# Patient Record
Sex: Female | Born: 1955 | Race: White | Hispanic: No
Health system: Southern US, Community
[De-identification: ages and names within clinical notes are randomized; demographics above are authoritative.]

---

## 2001-10-12 ENCOUNTER — Encounter: Admission: RE | Admit: 2001-10-12 | Discharge: 2001-10-12 | Payer: Self-pay | Admitting: Specialist

## 2001-10-12 ENCOUNTER — Encounter: Payer: Self-pay | Admitting: Specialist

## 2001-11-09 ENCOUNTER — Observation Stay (HOSPITAL_COMMUNITY): Admission: RE | Admit: 2001-11-09 | Discharge: 2001-11-11 | Payer: Self-pay | Admitting: Specialist

## 2001-11-09 ENCOUNTER — Encounter: Payer: Self-pay | Admitting: Specialist

## 2002-08-13 ENCOUNTER — Encounter: Admission: RE | Admit: 2002-08-13 | Discharge: 2002-08-13 | Payer: Self-pay | Admitting: Orthopaedic Surgery

## 2002-08-13 ENCOUNTER — Encounter: Payer: Self-pay | Admitting: Orthopaedic Surgery

## 2003-06-05 ENCOUNTER — Ambulatory Visit (HOSPITAL_COMMUNITY): Admission: RE | Admit: 2003-06-05 | Discharge: 2003-06-06 | Payer: Self-pay | Admitting: Orthopaedic Surgery

## 2003-09-11 ENCOUNTER — Observation Stay (HOSPITAL_COMMUNITY): Admission: RE | Admit: 2003-09-11 | Discharge: 2003-09-12 | Payer: Self-pay | Admitting: Specialist

## 2006-07-24 ENCOUNTER — Inpatient Hospital Stay: Payer: Self-pay | Admitting: Internal Medicine

## 2006-07-24 ENCOUNTER — Other Ambulatory Visit: Payer: Self-pay

## 2006-08-10 ENCOUNTER — Inpatient Hospital Stay: Payer: Self-pay | Admitting: Unknown Physician Specialty

## 2006-12-14 ENCOUNTER — Inpatient Hospital Stay: Payer: Self-pay | Admitting: Specialist

## 2006-12-14 ENCOUNTER — Other Ambulatory Visit: Payer: Self-pay

## 2007-04-08 ENCOUNTER — Inpatient Hospital Stay: Payer: Self-pay | Admitting: Unknown Physician Specialty

## 2012-10-06 ENCOUNTER — Emergency Department: Payer: Self-pay | Admitting: Emergency Medicine

## 2012-10-06 LAB — COMPREHENSIVE METABOLIC PANEL
Albumin: 3.7 g/dL (ref 3.4–5.0)
Alkaline Phosphatase: 130 U/L (ref 50–136)
Anion Gap: 5 — ABNORMAL LOW (ref 7–16)
Creatinine: 0.7 mg/dL (ref 0.60–1.30)
EGFR (Non-African Amer.): >60
Glucose: 103 mg/dL — ABNORMAL HIGH (ref 65–99)
Osmolality: 272 (ref 275–301)
Potassium: 4.3 mmol/L (ref 3.5–5.1)
SGPT (ALT): 28 U/L (ref 12–78)
Total Protein: 7.6 g/dL (ref 6.4–8.2)

## 2012-10-06 LAB — CBC
HCT: 31.6 % — ABNORMAL LOW (ref 35.0–47.0)
MCHC: 34 g/dL (ref 32.0–36.0)
MCV: 93 fL (ref 80–100)
Platelet: 281 10*3/uL (ref 150–440)
RBC: 3.39 10*6/uL — ABNORMAL LOW (ref 3.80–5.20)
WBC: 13.6 10*3/uL — ABNORMAL HIGH (ref 3.6–11.0)

## 2012-10-06 LAB — URINALYSIS, COMPLETE
Bacteria: NONE SEEN
Blood: NEGATIVE
Glucose,UR: NEGATIVE mg/dL (ref 0–75)
Hyaline Cast: 3
Nitrite: NEGATIVE
Protein: NEGATIVE
RBC,UR: 8 /HPF (ref 0–5)
Squamous Epithelial: 2
WBC UR: 37 /HPF (ref 0–5)

## 2012-12-31 ENCOUNTER — Ambulatory Visit: Payer: Self-pay | Admitting: Specialist

## 2012-12-31 LAB — CBC
HCT: 31.1 % — ABNORMAL LOW (ref 35.0–47.0)
HGB: 10.8 g/dL — ABNORMAL LOW (ref 12.0–16.0)
MCV: 94 fL (ref 80–100)
WBC: 13.3 10*3/uL — ABNORMAL HIGH (ref 3.6–11.0)

## 2012-12-31 LAB — URINALYSIS, COMPLETE
Bacteria: NONE SEEN
Bilirubin,UR: NEGATIVE
Ketone: NEGATIVE
Leukocyte Esterase: NEGATIVE
Protein: NEGATIVE
Specific Gravity: 1.011 (ref 1.003–1.030)
Squamous Epithelial: <1
WBC UR: <1 /HPF (ref 0–5)

## 2012-12-31 LAB — BASIC METABOLIC PANEL
Calcium, Total: 9.1 mg/dL (ref 8.5–10.1)
Chloride: 108 mmol/L — ABNORMAL HIGH (ref 98–107)
EGFR (Non-African Amer.): >60
Glucose: 109 mg/dL — ABNORMAL HIGH (ref 65–99)
Potassium: 4.3 mmol/L (ref 3.5–5.1)
Sodium: 139 mmol/L (ref 136–145)

## 2012-12-31 LAB — PROTIME-INR: Prothrombin Time: 12.3 secs (ref 11.5–14.7)

## 2012-12-31 LAB — MRSA PCR SCREENING

## 2012-12-31 LAB — APTT: Activated PTT: 32.5 secs (ref 23.6–35.9)

## 2013-01-07 ENCOUNTER — Inpatient Hospital Stay: Payer: Self-pay | Admitting: Specialist

## 2013-01-08 LAB — BASIC METABOLIC PANEL
BUN: 11 mg/dL (ref 7–18)
Chloride: 107 mmol/L (ref 98–107)
Creatinine: 0.82 mg/dL (ref 0.60–1.30)
Osmolality: 275 (ref 275–301)
Potassium: 3.7 mmol/L (ref 3.5–5.1)

## 2013-01-08 LAB — CBC WITH DIFFERENTIAL/PLATELET
Basophil #: 0 10*3/uL (ref 0.0–0.1)
Basophil %: 0.2 %
Eosinophil #: 0 10*3/uL (ref 0.0–0.7)
HGB: 7.7 g/dL — ABNORMAL LOW (ref 12.0–16.0)
MCH: 32.6 pg (ref 26.0–34.0)
MCHC: 34.3 g/dL (ref 32.0–36.0)
Platelet: 209 10*3/uL (ref 150–440)
RBC: 2.37 10*6/uL — ABNORMAL LOW (ref 3.80–5.20)
RDW: 14 % (ref 11.5–14.5)

## 2013-01-11 LAB — URINALYSIS, COMPLETE
Bacteria: NONE SEEN
Bilirubin,UR: NEGATIVE
Blood: NEGATIVE
RBC,UR: <1 /HPF (ref 0–5)
Specific Gravity: 1.005 (ref 1.003–1.030)
Squamous Epithelial: NONE SEEN

## 2013-02-14 ENCOUNTER — Ambulatory Visit: Payer: Self-pay | Admitting: Specialist

## 2013-03-20 ENCOUNTER — Emergency Department: Payer: Self-pay | Admitting: Emergency Medicine

## 2013-06-21 ENCOUNTER — Emergency Department: Payer: Self-pay | Admitting: Emergency Medicine

## 2013-06-21 LAB — COMPREHENSIVE METABOLIC PANEL WITH GFR
Albumin: 3.6 g/dL
Alkaline Phosphatase: 89 U/L
Anion Gap: 5 — ABNORMAL LOW
BUN: 21 mg/dL — ABNORMAL HIGH
Bilirubin,Total: 0.5 mg/dL
Calcium, Total: 8.8 mg/dL
Chloride: 109 mmol/L — ABNORMAL HIGH
Co2: 28 mmol/L
Creatinine: 0.84 mg/dL
EGFR (African American): >60
EGFR (Non-African Amer.): >60
Glucose: 91 mg/dL
Osmolality: 286
Potassium: 3.7 mmol/L
SGOT(AST): 8 U/L — ABNORMAL LOW
SGPT (ALT): 18 U/L
Sodium: 142 mmol/L
Total Protein: 6.8 g/dL

## 2013-06-21 LAB — CBC
HCT: 31 % — ABNORMAL LOW (ref 35.0–47.0)
HGB: 10.6 g/dL — AB (ref 12.0–16.0)
MCH: 33 pg (ref 26.0–34.0)
MCHC: 34.4 g/dL (ref 32.0–36.0)
MCV: 96 fL (ref 80–100)
PLATELETS: 147 10*3/uL — AB (ref 150–440)
RBC: 3.23 10*6/uL — AB (ref 3.80–5.20)
RDW: 14.1 % (ref 11.5–14.5)
WBC: 8.3 10*3/uL (ref 3.6–11.0)

## 2013-10-03 ENCOUNTER — Ambulatory Visit: Payer: Self-pay | Admitting: Orthopedic Surgery

## 2013-10-03 LAB — CBC WITH DIFFERENTIAL/PLATELET
Basophil #: 0.3 10*3/uL — ABNORMAL HIGH (ref 0.0–0.1)
Basophil %: 3.1 %
Eosinophil #: 0.1 10*3/uL (ref 0.0–0.7)
Eosinophil %: 0.8 %
HCT: 34 % — ABNORMAL LOW (ref 35.0–47.0)
HGB: 11.4 g/dL — ABNORMAL LOW (ref 12.0–16.0)
Lymphocyte #: 2 10*3/uL (ref 1.0–3.6)
Lymphocyte %: 24.4 %
MCH: 32 pg (ref 26.0–34.0)
MCHC: 33.5 g/dL (ref 32.0–36.0)
MCV: 95 fL (ref 80–100)
Monocyte #: 0.4 x10 3/mm (ref 0.2–0.9)
Monocyte %: 4.8 %
Neutrophil #: 5.4 10*3/uL (ref 1.4–6.5)
Neutrophil %: 66.9 %
Platelet: 149 10*3/uL — ABNORMAL LOW (ref 150–440)
RBC: 3.56 10*6/uL — ABNORMAL LOW (ref 3.80–5.20)
RDW: 13.7 % (ref 11.5–14.5)
WBC: 8.1 10*3/uL (ref 3.6–11.0)

## 2013-10-03 LAB — COMPREHENSIVE METABOLIC PANEL
ALK PHOS: 119 U/L — AB
ALT: 23 U/L (ref 12–78)
ANION GAP: 8 (ref 7–16)
AST: 11 U/L — AB (ref 15–37)
Albumin: 4.1 g/dL (ref 3.4–5.0)
BUN: 14 mg/dL (ref 7–18)
Bilirubin,Total: 0.3 mg/dL (ref 0.2–1.0)
CALCIUM: 9.2 mg/dL (ref 8.5–10.1)
CO2: 28 mmol/L (ref 21–32)
CREATININE: 0.66 mg/dL (ref 0.60–1.30)
Chloride: 105 mmol/L (ref 98–107)
EGFR (Non-African Amer.): >60
Glucose: 106 mg/dL — ABNORMAL HIGH (ref 65–99)
Osmolality: 282 (ref 275–301)
POTASSIUM: 3.8 mmol/L (ref 3.5–5.1)
Sodium: 141 mmol/L (ref 136–145)
Total Protein: 7.5 g/dL (ref 6.4–8.2)

## 2013-10-03 LAB — PROTIME-INR
INR: 1
Prothrombin Time: 12.7 secs (ref 11.5–14.7)

## 2013-11-19 ENCOUNTER — Observation Stay: Payer: Self-pay | Admitting: Specialist

## 2013-11-19 LAB — CBC WITH DIFFERENTIAL/PLATELET
BASOS PCT: 0.5 %
Basophil #: 0 10*3/uL (ref 0.0–0.1)
EOS PCT: 3.7 %
Eosinophil #: 0.4 10*3/uL (ref 0.0–0.7)
HCT: 32.1 % — ABNORMAL LOW (ref 35.0–47.0)
HGB: 10.8 g/dL — ABNORMAL LOW (ref 12.0–16.0)
LYMPHS ABS: 2.4 10*3/uL (ref 1.0–3.6)
LYMPHS PCT: 24.8 %
MCH: 31.8 pg (ref 26.0–34.0)
MCHC: 33.5 g/dL (ref 32.0–36.0)
MCV: 95 fL (ref 80–100)
MONO ABS: 0.5 x10 3/mm (ref 0.2–0.9)
MONOS PCT: 4.8 %
NEUTROS PCT: 66.2 %
Neutrophil #: 6.5 10*3/uL (ref 1.4–6.5)
PLATELETS: 155 10*3/uL (ref 150–440)
RBC: 3.38 10*6/uL — ABNORMAL LOW (ref 3.80–5.20)
RDW: 13.6 % (ref 11.5–14.5)
WBC: 9.8 10*3/uL (ref 3.6–11.0)

## 2013-12-30 ENCOUNTER — Emergency Department: Payer: Self-pay | Admitting: Emergency Medicine

## 2014-08-25 IMAGING — CR DG HIP 1V*L*
1 series · 1 of 1 positions shown · non-contrast
Comparison: none

REASON FOR EXAM: POST OP
COMMENTS:   Bedside (portable):Y

PROCEDURE:     DXR - DXR HIP LEFT ONE VIEW  - January 07, 2013  [DATE]
RESULT:     Findings: AP pelvis and lateral view of the left hip
demonstrates a total left hip arthroplasty without dislocation. There are
postsurgical changes surrounding the left hip. The right hip is unremarkable.

[lat]
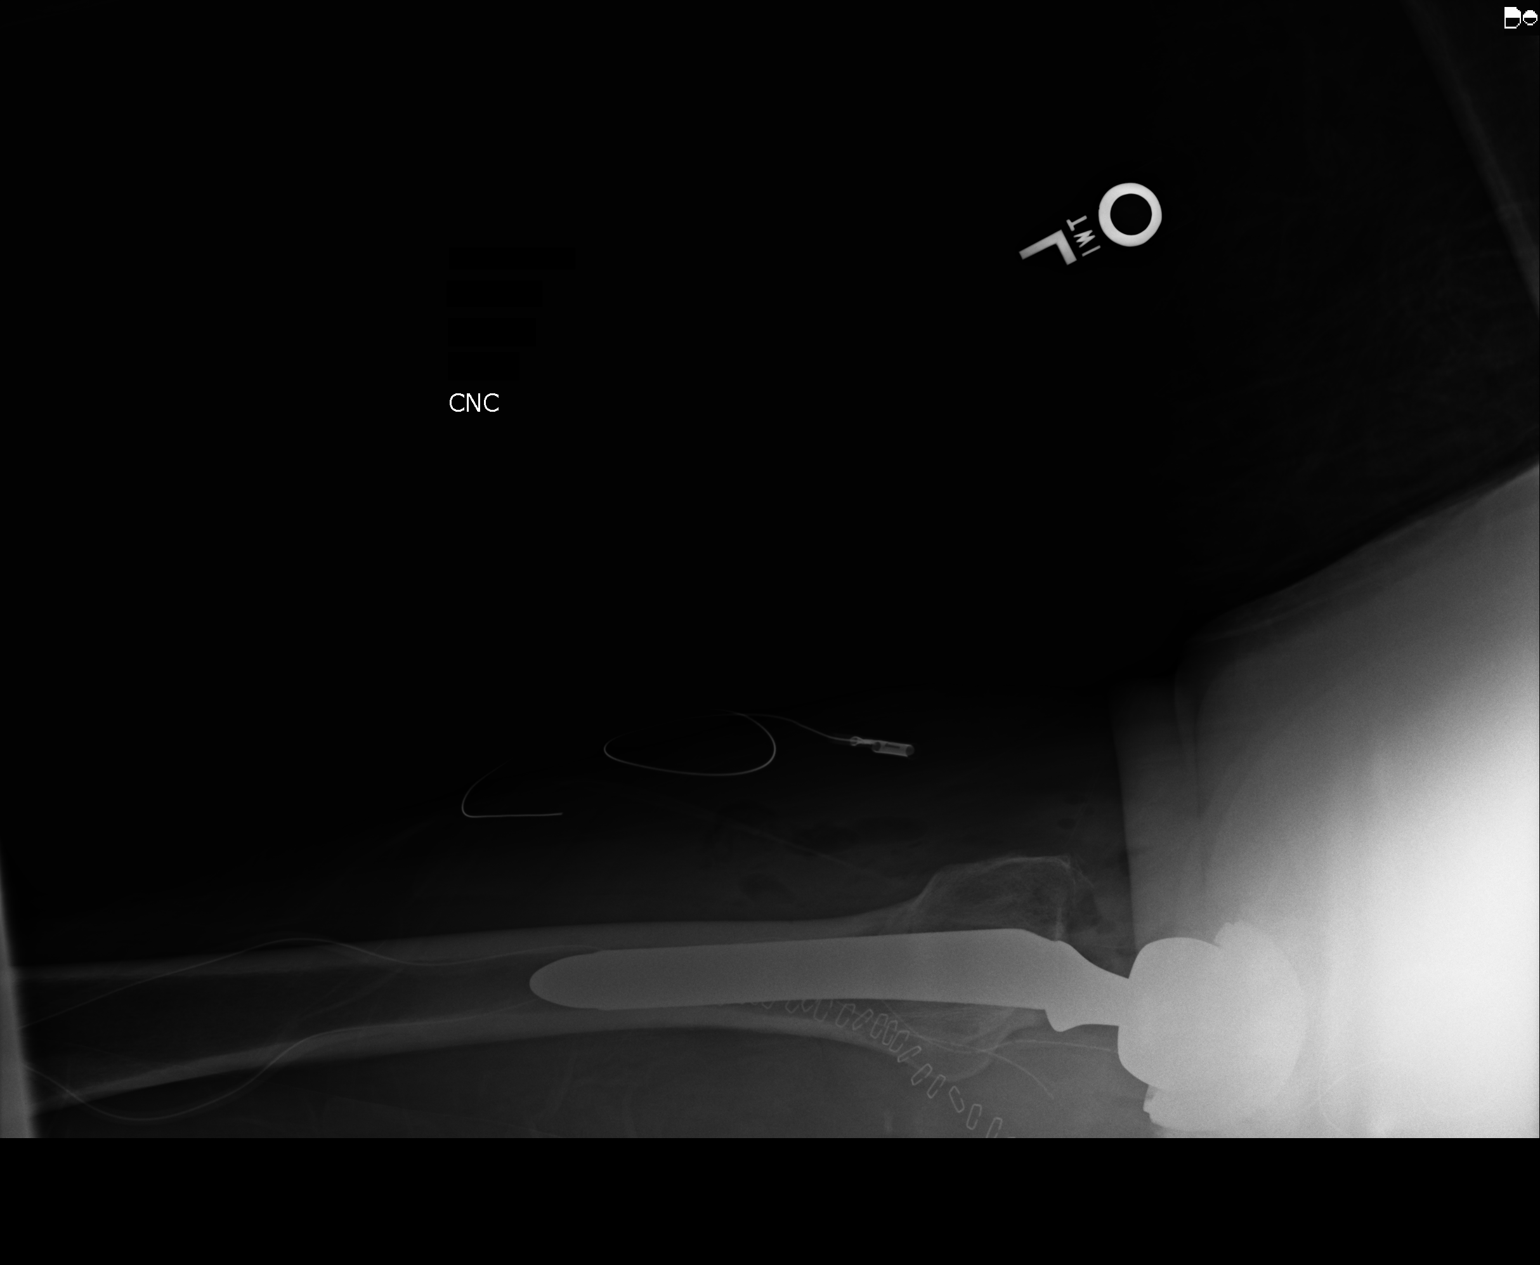

[1 of 1 positions shown; findings below may reference images not displayed]

IMPRESSION: Please see above.

## 2014-08-25 IMAGING — CR PELVIS - 1-2 VIEW
1 series · 2 of 2 positions shown · non-contrast
Comparison: none

REASON FOR EXAM: POST OP IN PACU
COMMENTS:   Bedside (portable):Y

PROCEDURE:     DXR - DXR PELVIS AP ONLY  - January 07, 2013  [DATE]
RESULT:     Findings: AP pelvis and lateral view of the left hip
demonstrates a total left hip arthroplasty without dislocation. There are
postsurgical changes surrounding the left hip. The right hip is unremarkable.

[Series 1: ap · 0.17mm/px · 2 of 2 slices shown]
[im 1/2]
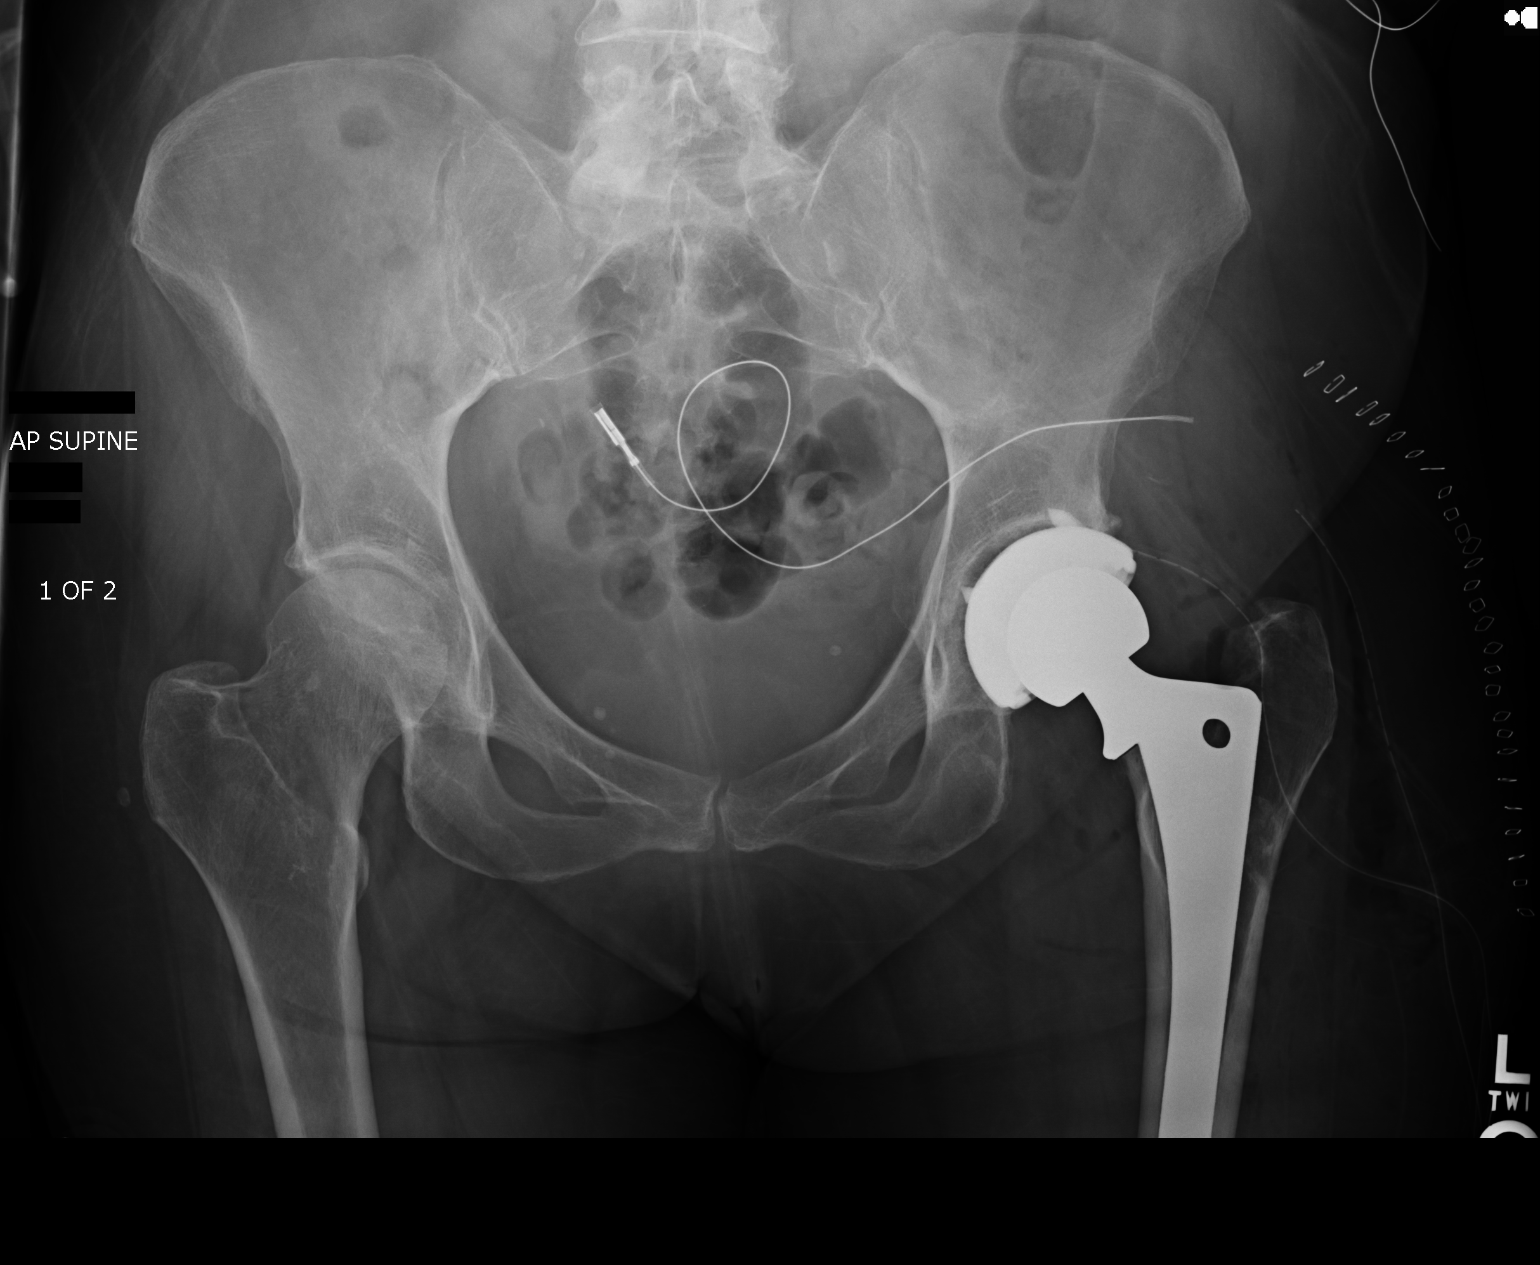
[im 2/2]
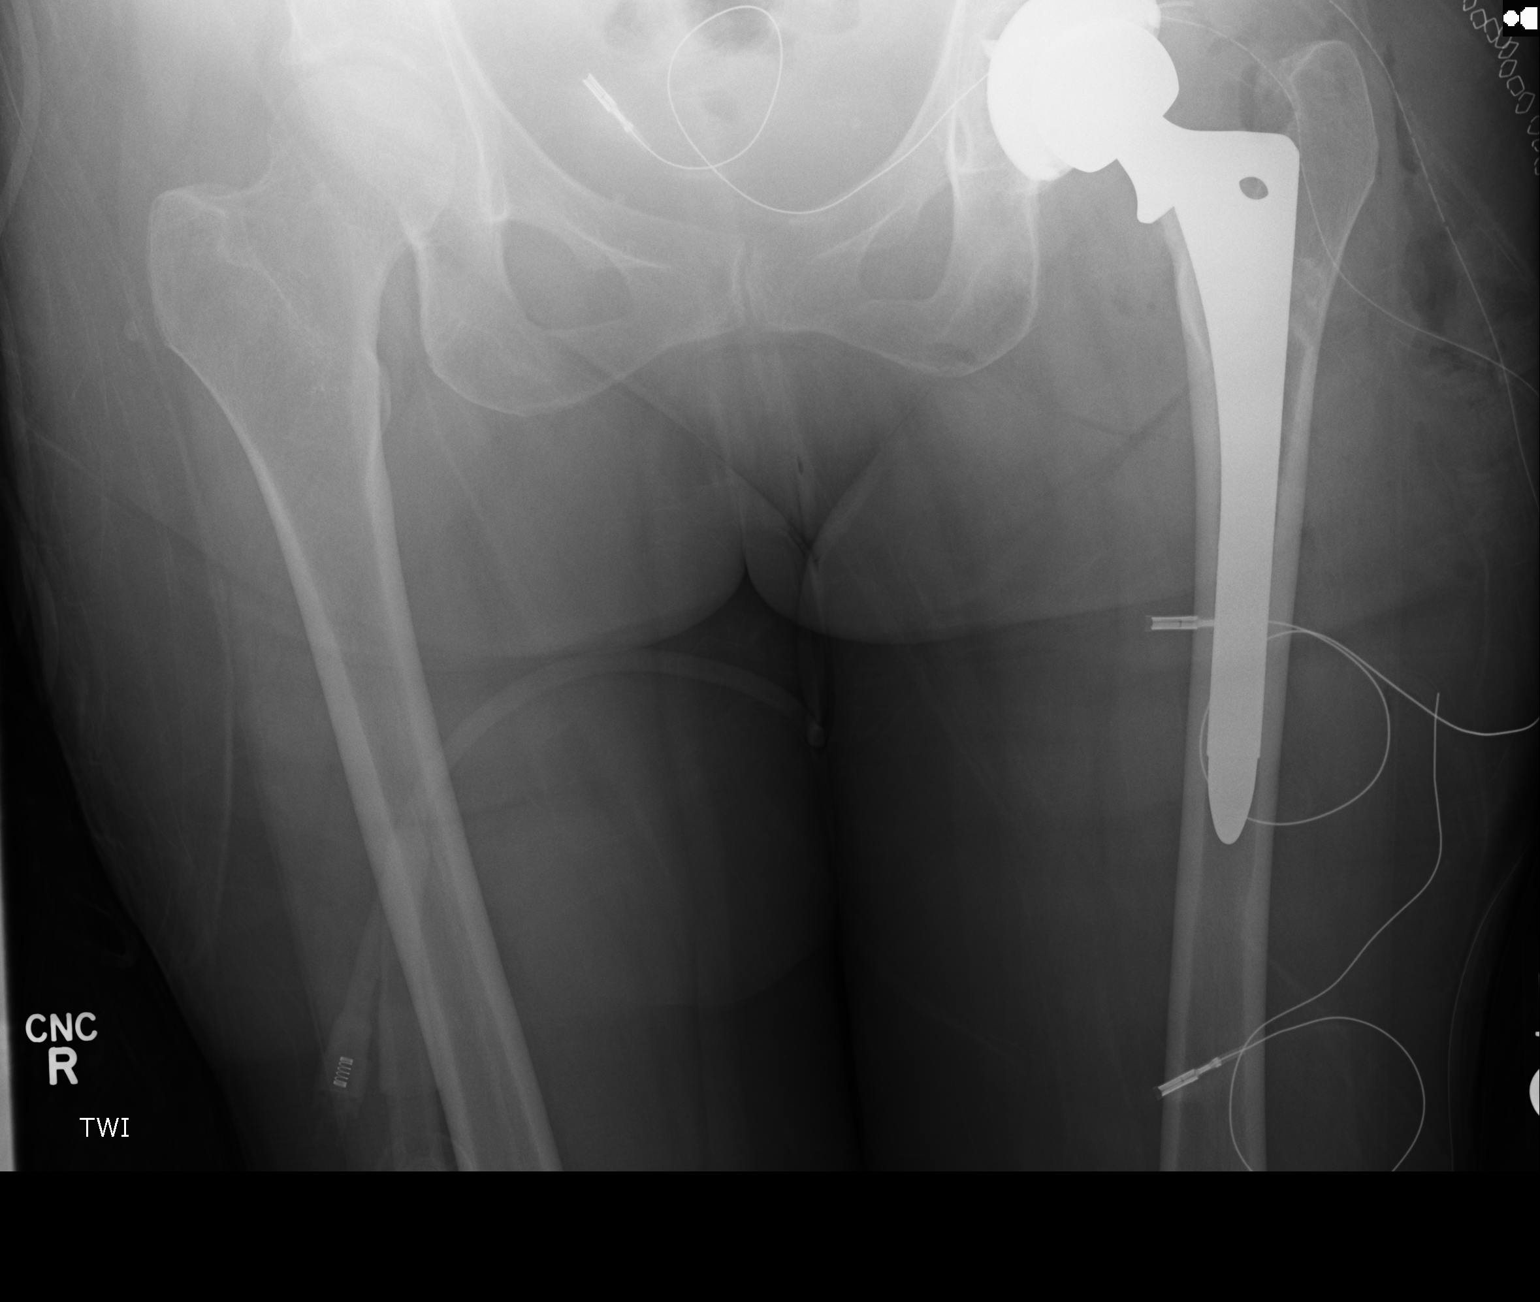

[2 of 2 positions shown; findings below may reference images not displayed]

IMPRESSION: Please see above.

## 2014-08-28 IMAGING — CR DG CHEST 1V PORT
1 series · 1 of 1 positions shown · non-contrast
Comparison: none

REASON FOR EXAM: tachycardia
COMMENTS:

[ap]
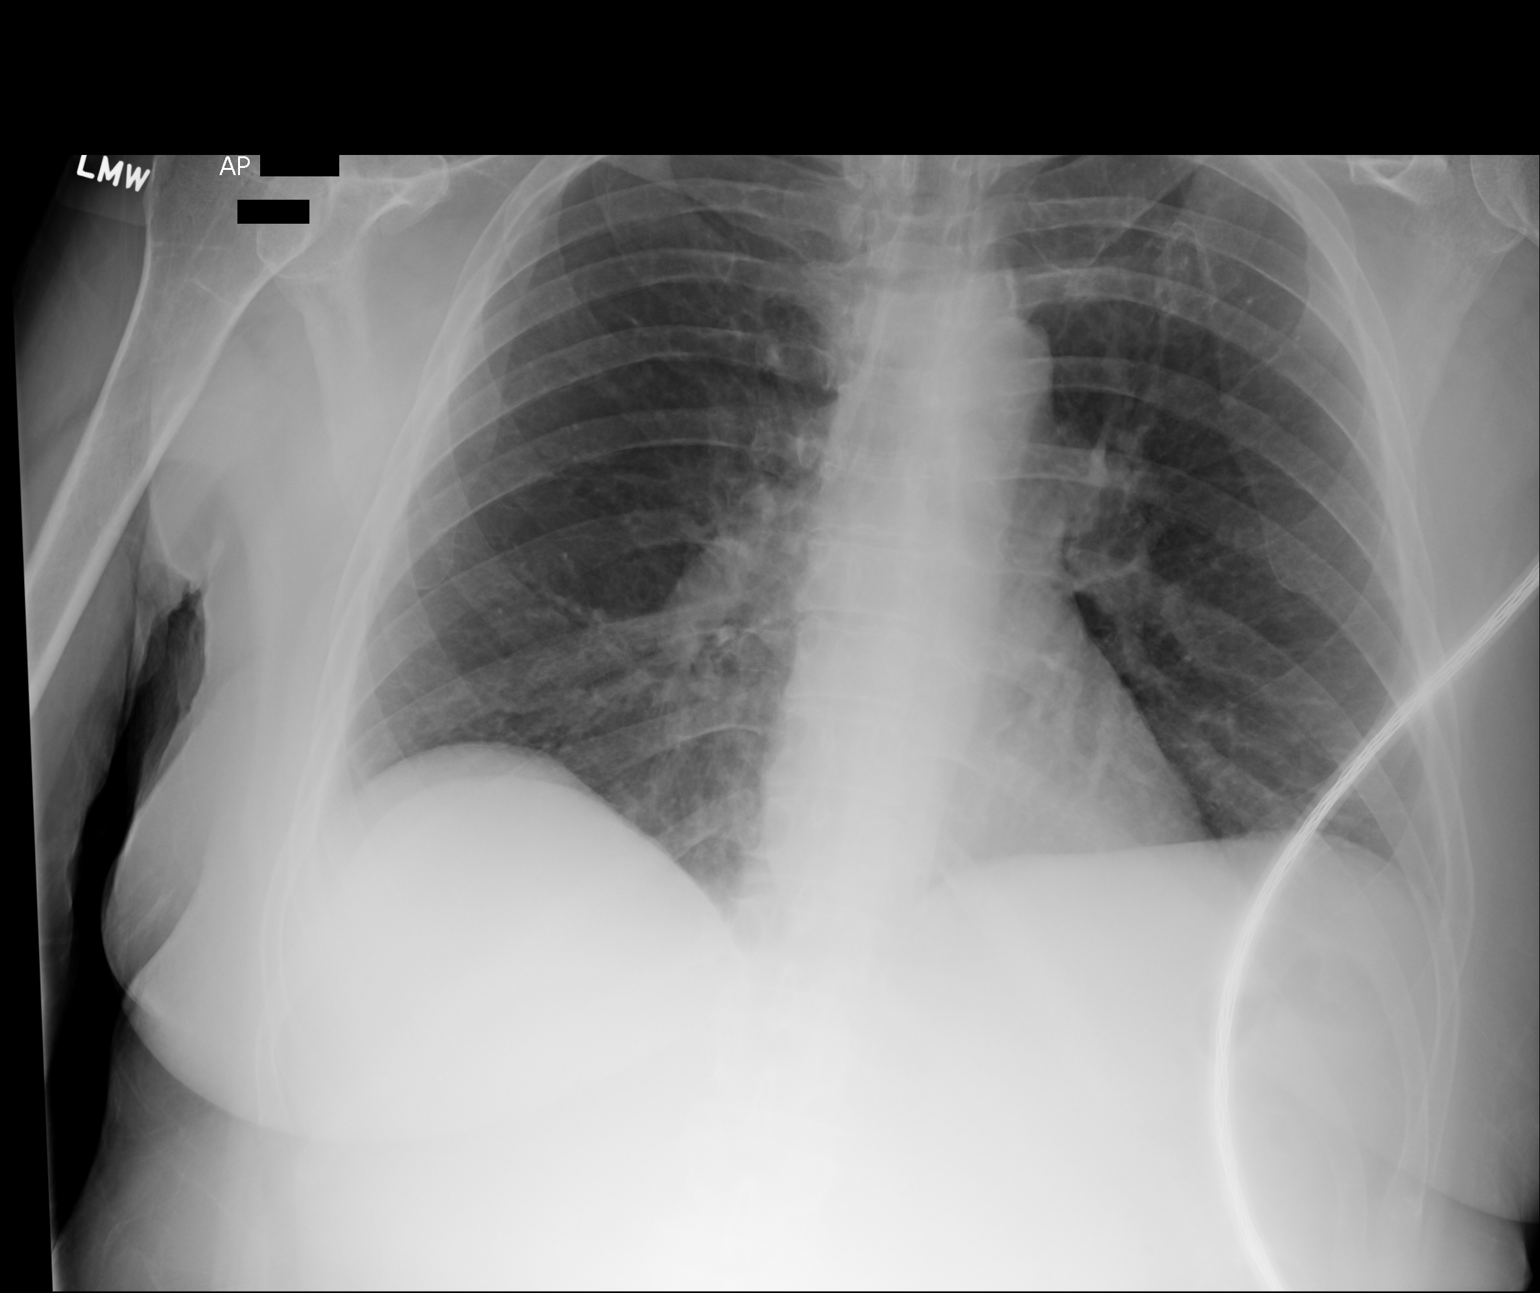

[1 of 1 positions shown; findings below may reference images not displayed]

PROCEDURE:     DXR - DXR PORTABLE CHEST SINGLE VIEW  - January 11, 2013 [DATE]

RESULT:     Comparison is made to a supine chest x-ray January 14, 2007.

The lungs are less well inflated today. The interstitial markings are
slightly more conspicuous bilaterally. The cardiac silhouette is normal in
size. The pulmonary vascularity is not engorged. The mediastinum is normal
in width. There is no pleural effusion or alveolar infiltrate. The observed
portions of the bony thorax exhibit no acute abnormalities.
IMPRESSION: There is slightly increased prominence of the pulmonary
interstitium which is nonspecific but could reflect minimal interstitial
edema. The pulmonary vascularity and cardiac silhouette appear normal
however. When the patient can tolerate the procedure, a PA and lateral chest
x-ray would be of value.

[REDACTED]

## 2014-09-05 NOTE — H&P (Signed)
   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  Conservative treatment has failed and she is in constant pain  Left total hip replacement has been recommended to her for this and she agrees.  Risks and benefits of surgery were discussed at length including but not limited to infection, non union, nerve or blood vessed damage, non union, need for repeat surgery, blood clots and lung emboli, and death. .   Past Med/Surgical Hx:  Pneumonia:   asthma:   depression:   arthritis:   ORIF right tibia fibula:   eye surgery:   back surgery: 3 times  neck surg:   ALLERGIES:  Sulfa drugs: Swelling  Aspirin: Swelling  HOME MEDICATIONS: Medication Instructions Status  SEROquel 200 mg oral tablet 2 tab(s) orally once a day (at bedtime) Active  KlonoPIN 1 mg oral tablet 3 tab(s) orally once a day (at bedtime) Active  traMADol 50 mg oral tablet 1 tab(s) orally every 4 hours, As Needed Active  acetaminophen-HYDROcodone 325 mg-5 mg oral tablet 1 tab(s) orally every 6 hours, As Needed Active  Colace sodium 100 mg oral capsule 1 cap(s) orally 2 times a day Active  ferrous sulfate 325 mg oral tablet 1 tab(s) orally 3 times a day Active  Fish Oil 1000 mg oral capsule 1 cap(s) orally 2 times a day Active  biotin 5 mg oral capsule 1 cap(s) orally 2 times a day Active  One-A-Day Essentials Multiple Vitamins oral tablet 1 tab(s) orally once a day Active  Calcium 600+D 1 tab(s) orally once a day Active   Family and Social History:  Family History Non-Contributory  Smoking   Social History negative tobacco, negative ETOH   Place of Living Home   Review of Systems:  Fever/Chills No   Cough No   Sputum No   Abdominal Pain No   Physical Exam:  GEN well developed, well nourished,  no acute distress   HEENT pink conjunctivae   NECK supple   RESP normal resp effort   CARD regular rate   ABD denies tenderness   LYMPH negative neck   EXTR negative edema, Pain with range of motion left hip.  circulation/sensation/motor function good. Leg lengths good.   SKIN normal to palpation   NEURO motor/sensory function intact   PSYCH alert, A+O to time, place, person    Assessment/Admission Diagnosis Left hip arthritis secondary to pin penetration   Plan Left total hip replacement   Electronic Signatures: Valinda HoarMiller, Tyshae Stair E (MD)  (Signed 22-Aug-14 11:24)  Authored: CHIEF COMPLAINT and HISTORY, PAST MEDICAL/SURGIAL HISTORY, ALLERGIES, HOME MEDICATIONS, FAMILY AND SOCIAL HISTORY, REVIEW OF SYSTEMS, PHYSICAL EXAM, ASSESSMENT AND PLAN   Last Updated: 22-Aug-14 11:24 by Valinda HoarMiller, Kiam Bransfield E (MD)

## 2014-09-05 NOTE — Discharge Summary (Signed)
PATIENT NAME:  Kristin Mooney, [name] MR#:  [id number] DATE OF BIRTH:  Dec 10, 1955  DATE OF ADMISSION:  01/07/2013 DATE OF DISCHARGE:  01/11/2013  FINAL DIAGNOSES: 1.  Advanced osteoarthritis of the left hip.  2.  Asthma.  3.  Depression. 4.  Arthritis.   OPERATION: On 01/07/2013 cementless AML left total hip replacement.   COMPLICATIONS: None.   CONSULTATIONS: None.   DISCHARGE MEDICATIONS: 1.  Enteric-coated aspirin 1 p.o. b.i.d.  2.  Iron 325 mg daily.  3.  Norco 5/325 mg 1 to 2 q. 6 hours p.r.n. pain.  4.  Seroquel 200 mg 2 at bedtime.  5.  Klonopin 1 mg 3 tabs at bedtime.  6.  Colace as needed. 7.  Fish oil once twice a day. 8.  Biotin 5 mg once twice a day. 9.  Vitamins once a day. 10.  Calcium with vitamin D once a day.   HISTORY OF PRESENT ILLNESS: The patient is a 59 year old female 6 years post pinning of the left hip for a subcapital fracture. She developed avascular necrosis and some collapse of the femoral head and resultant arthritis in the joint. One of the screws was noted to be very close to the articular surface as well. The patient failed all nonoperative treatment including rest, crutch walking and NSAIDs. Total hip surgery was recommended, and the patient agreed to this. Risks and benefits and postoperative protocol were discussed with her.  PAST MEDICAL HISTORY: Illnesses: As above.  HOSPITALIZATIONS: For pneumonia.   PAST SURGICAL HISTORY: 1.  ORIF of the right tibia-fibula. 2.  Eye surgery.  3.  Back surgery. 4.  Neck surgery.   ALLERGIES: SULFA DRUGS CAUSE SWELLING, ASPIRIN SHE WAS TOLD SHE WAS ALLERGIC TO IN THE PAST BUT SHE HAS BEEN TAKING IT WITHOUT PROBLEMS IN RECENT YEARS.  HOME MEDICATIONS: As above.   FAMILY HISTORY: Unremarkable.   SOCIAL HISTORY: The patient lives at home. Does not smoke or drink.   REVIEW OF SYSTEMS: Unremarkable.  PHYSICAL EXAMINATION: The patient was alert and cooperative. The right hip was normal to exam. Left hip showed  well-healed stab wounds. Hip motion was restricted by pain in all directions. There was some crepitus. Leg lengths showed minimal shortening on the left. Neurovascular status was good.   LABORATORY DATA: On admission was satisfactory.   HOSPITAL COURSE: On 01/07/2013, the patient underwent cementless left total hip replacement. Postoperatively she did well. X-rays showed satisfactory positioning of the components. Hemoglobin was 8.6 on the second postop day. She had much less pain in the hip itself. Her wound was benign. She had fever to 102 degrees the night prior to discharge, but this dropped. Her wound was benign with no redness or swelling. She is discharged home on partial weight-bearing on a walker. She will start outpatient PT in my office next week as her insurance does not cover home care. We will see her in the office for exam and x-rays and stitch removal in 2 weeks. ____________________________ Valinda HoarHoward E. Traeger Sultana, MD hem:sb D: 01/11/2013 11:22:18 ET T: 01/11/2013 12:01:48 ET JOB#: [id number]  cc: Valinda HoarHoward E. Dayvion Sans, MD, <Dictator> Valinda HoarHOWARD E Marcus Groll MD ELECTRONICALLY SIGNED 01/12/2013 22:31

## 2014-09-05 NOTE — Consult Note (Signed)
PATIENT NAME:  Kristin Mooney, Kristin Mooney MR#:  [id number] DATE OF BIRTH:  1955/08/07  DATE OF CONSULTATION:  01/11/2013  REFERRING PHYSICIAN:  Deeann Saint, MD CONSULTING PHYSICIAN:  Starleen Arms, MD  REASON FOR MEDICAL CONSULTATION: Sinus tachycardia.   HISTORY OF PRESENT ILLNESS: This is a 59 year old female without significant past medical history who was admitted 08/25 for left total hip replacement. Today is postop day number 4. The patient had her surgery without complications. Medicine was consulted today for sinus tachycardia as the patient was noticed to have heart rate in the 120s to 130s. The patient denies any chest pain, any shortness of breath. Reports she is tolerating physical therapy very well. Denies any hemoptysis, any leg swelling. She was started on DVT prophylaxis with sub-Q Lovenox on postop day number 1. No hypoxia. The patient reports her pain is occasionally uncontrolled.  As well, her blood pressure appears to be on the lower side. She appears to be mildly dehydrated as well.  As well she had drop in hemoglobin postop from baseline around 10.5 to 7.7 after surgery.   PAST MEDICAL HISTORY: 1.  Arthritis.  2.  Asthma.  3.  Depression.   PAST SURGICAL HISTORY: 1.  Back surgery.  2.  Cervical surgery.  3.  Eye surgery.  4.  Recent left hip replacement.   ALLERGIES: SULFA AND ASPIRIN.   SOCIAL HISTORY: The patient denies any alcohol use, any illicit drug use. Reports she quit smoking before 6 months.   FAMILY HISTORY: Denies any coronary artery disease at young age.  CURRENT MEDICATIONS: 1.  Tylenol 650 mg every 4 hours as needed.  2.  Percocet 1 to 2 tablets every 4 to 6 hours as needed.  3.  Dulcolax suppository as needed.  4.  Docusate 240 mg oral at bedtime.  5.  Gabapentin 400 mg p.o. b.i.d.  6.  Milk of Magnesia 30 mL p.o. b.i.d. as needed.  7.  Mobic 7.5 mg oral daily.  8.  Morphine 1 to 2 mg IV every 1 to 2 hours as needed.  9.  Klonopin 3 mg oral at  bedtime.  10.  Seroquel 400 mg oral at bedtime.   REVIEW OF SYSTEMS: CONSTITUTIONAL: The patient denies fever, chills, fatigue, weakness, weight gain, weight loss.  EYES: Denies blurry vision, double vision, inflammation, glaucoma.  ENT: Denies tinnitus, ear pain, hearing loss, epistaxis.  RESPIRATORY: Denies cough, wheezing, hemoptysis or COPD. CARDIOVASCULAR: Denies chest pain, edema, arrhythmia, palpitations, syncope.  GASTROINTESTINAL: Denies nausea, vomiting, diarrhea, abdominal pain, hematemesis.  GENITOURINARY: Denies dysuria, hematuria or renal colic.  ENDOCRINE: Denies polyuria, polydipsia, heat or cold intolerance.  HEMATOLOGY: Denies anemia, easy bruising or bleeding diathesis. INTEGUMENTARY: Denies any acne, rash or skin lesions.  MUSCULOSKELETAL: Has history of arthritis.  NEUROLOGIC: Denies CVA, TIA, ataxia or dementia.  PSYCHIATRIC: Denies anxiety, insomnia, substance abuse or alcohol abuse.   PHYSICAL EXAMINATION: VITAL SIGNS: Temperature 98.7, pulse 107, respiratory rate 18, blood pressure 99/66, saturating 97% on room air.  GENERAL: Well-nourished female who looks comfortable in no apparent distress.  HEENT: Head atraumatic, normocephalic. Pupils equal and reactive to light. Pink conjunctivae. Anicteric sclerae. Moist oral mucosa.  NECK: Supple. No thyromegaly. No JVD.  LUNGS: Good air entry bilaterally. No wheezing, rales or rhonchi.  HEART: S1, S2 heard. No rubs, murmurs or gallops.  ABDOMEN: Soft, nontender and nondistended. Bowel sounds present.  EXTREMITIES: No edema. No clubbing. No cyanosis. Has left lower extremity in traction.   PERTINENT LABORATORY AND DIAGNOSTICS: As  of 08/26: Glucose 101, BUN 11, creatinine 0.82, sodium 138, potassium 3.7, chloride 107. Hemoglobin as of 08/27 is 8.6. Urinalysis is negative for leukocyte esterase and nitrites.   Chest x-ray does not show any infiltrate or volume overload.  ASSESSMENT AND PLAN: This is a 59 year old female  status post left hip replacement, postop day number 4. Medicine is consulted for sinus tachycardia.  1.  Sinus tachycardia.  Appears to be multifactorial. This is most likely related due to pain, as well as anemia of acute blood loss and dehydration. Will increase the patient's fluid basal rate at 100 mL per hour. Will give her 250 mL of fluid bolus, and for anemia she is already on iron supplement. Pulmonary embolus at this point is very unlikely as the patient is on deep vein thrombosis prophylaxis from day number 1. As well, she is not hypoxic, never had any complaints of chest pain or hemoptysis. As well, she is afebrile, does not appear to have any infection, negative urinalysis, no infiltrate on the chest x-ray and does not have any cough. 2.  Anemia of acute blood loss. Continue with iron supplement.  3.  Deep vein thrombosis prophylaxis. Subcu Lovenox.  TIME SPENT ON MEDICAL CONSULTATION: 45 minutes.  ____________________________ Starleen Armsawood S. Nesta Scaturro, MD dse:sb D: 01/11/2013 06:27:14 ET T: 01/11/2013 07:40:52 ET JOB#: [id number]  cc: Starleen Armsawood S. Kortnee Bas, MD, <Dictator> Lindzey Zent Teena IraniS Huberta Tompkins MD ELECTRONICALLY SIGNED 01/12/2013 7:10

## 2014-09-05 NOTE — Op Note (Signed)
PATIENT NAME:  Kristin Mooney, Kristin Mooney MR#:  [id number] DATE OF BIRTH:  18-Jan-1956  DATE OF PROCEDURE:  01/07/2013  PREOPERATIVE DIAGNOSIS: Avascular necrosis, left hip, with traumatic arthritis.   POSTOPERATIVE DIAGNOSIS: Avascular necrosis, left hip, with traumatic arthritis.   OPERATION: Left DePuy AML total hip replacement (13.5 narrow stem, 52 mm series 300 cup, +4 mm polyethylene insert with 10-degree lip, 36 mm metal head with -2 mm neck length).   Hardware removal, left hip (four 7.5 mm cancellous screws).   SURGEON: Kristin Mooney, M.D.   ASSISTANT: Dr. Katrinka Blazing  ANESTHESIA: General endotracheal.   COMPLICATIONS: None.   DRAINS: Two Autovacs.   ESTIMATED BLOOD LOSS: 200 mL.   REPLACEMENT: None.   DESCRIPTION OF PROCEDURE: The patient was brought to the Operating Room where she underwent satisfactory general endotracheal anesthesia due to 3 prior lumbar surgeries. The patient was turned in the right lateral decubitus position and positioned appropriately with the hip grips. The left hip was prepped and draped in sterile fashion and a posterolateral incision made.   Dissection was carried out sharply through subcutaneous tissue. The fascia was divided and 3 of the 4 screws were removed. The short external rotators were divided and tagged. The posterior capsule was opened in a T fashion. A guide pin was placed superior to the acetabulum and to use as a guide length measurement device. The hip was dislocated. There was avascular necrosis and loose cartilage over the superior surface of the head.   When this was removed, the tip of the remaining screw was visible. The femoral neck was cut around the remaining screw and the head was brought out proximally, bringing the remaining screw with it since it had been buried beneath the cortex. The acetabulum was then cleared of all debris. The labrum was debrided. The capsule was tagged. The head was measured at 51 mm.   First a 50 mm followed by a  51 mm reamer was then used to ream the acetabulum. Once this was completed, a trial 52 mm acetabulum was inserted and fit snugly. This was removed. After thorough irrigation, a 52 mm series 300 cup was inserted snugly. There is about 50 degrees of abduction and 20 degrees of anteversion. The trial liner was inserted. The femoral canal was then cleared of debris and reamed to 13 mm. It was then broached 13.5 mm. A trial reduction was then carried out using a +1.5 mm neck length and a 36 mm ball. This reduced well and it  appeared to be of good length. However, the rasp was countersunk some. The trials were removed after reduction and showing that the hip was completely stable.   The joint was thoroughly irrigated. The polyethylene liner was placed in the cuff after an  elimination screw was inserted. The 10 mm build-up was placed superiorly and posteriorly. The  13.5 mm AML stem was inserted and seated fully on the calcar. This was a little prouder than the rasp   . A  trial reduction was carried out with a +1.5 and a -2 mm neck length and the leg length appeared to be better with the -2 mm one. Therefore, a 36 mm head with a -2 neck length was inserted. The hip was reduced and was stable. The alignment jig showed good length. The hip was stable. The wounds were irrigated and the alignment pin was removed. The capsule was closed with #2 Tycron. The short external rotators were repaired with a suture. The capsule was then  closed over an Autovac drain with #2 Quill suture. The subcutaneous tissue was closed after irrigation with 0 Quill over another Autovac drain. The skin was closed with staples and dry sterile dressing was applied. The TENS pads and dry sterile dressing were applied and the patient was transferred to her hospital bed and taken to recovery in good condition.   ____________________________ Kristin HoarHoward E. Lissandro Dilorenzo, MD hem:np D: 01/07/2013 15:37:10 ET T: 01/07/2013 16:51:13  ET JOB#: [id number]  cc: Kristin HoarHoward E. Chaze Hruska, MD, <Dictator> Kristin HoarHOWARD E Kristin Asman MD ELECTRONICALLY SIGNED 01/09/2013 12:13

## 2014-09-06 NOTE — Op Note (Signed)
PATIENT NAME:  Kristin Mooney, Kristin Mooney MR#:  [id number] DATE OF BIRTH:  May 19, 1955  DATE OF PROCEDURE:  10/03/2013.  PREOPERATIVE DIAGNOSIS:  Left dislocated total hip arthroplasty.   POSTOPERATIVE DIAGNOSIS:  Left dislocated total hip arthroplasty.   PROCEDURE:  Closed reduction and application of abduction brace for left dislocated total hip arthroplasty.   ANESTHESIA:  General.   SURGEON:  Kathreen DevoidKevin L. Korrie Hofbauer, M.D.   COMPLICATIONS:  None.   ESTIMATED BLOOD LOSS:  None.   INDICATIONS FOR PROCEDURE:  [name] is a 59 year old female who underwent a total hip arthroplasty in August. She has previously dislocated her hip and was treated beginning in approximately February with a hip abduction brace for approximately 2 months. She had been doing well until she stumbled pulling a plug out of the wall at home. She sustained a left hip dislocation. She was brought to the [facility] where a superior and posterior dislocation was confirmed. I recommended a closed reduction attempt. I reviewed the risks and benefits of the procedure with the patient and she agreed to proceed with the closed reduction in the OR under general anesthesia.   PROCEDURE:  The patient was brought to the Operating Room and she was given short-acting general anesthesia through her IV. She was supported with an O2 facemask. A time-out was performed to verify the patient's name, date of birth, medical record number, correct site of surgery and correct procedure to be performed. Once all in attendance were in agreement, the case began. The patient's hip was flexed to 90 degrees with internal rotation. Traction was applied to the left hip as external rotation then applied to the hip. The hip popped gently into place. The patient's leg length was re-established and she was no longer in internal rotation. She had palpable pedal pulses following the procedure. Leg lengths were equivalent. C-arm  images following reduction confirmed the prosthesis was located within the acetabulum. Her hip abduction brace from home was then reapplied to her left thigh. She was then awakened and brought to the PACU in stable condition. In the PACU, the patient was neurovascularly intact with palpable pedal pulses. She has intact sensation to light touch throughout the left foot and she had intact motor function, including ability to flex and extend toes and dorsiflex and plantar flex her ankle. There was no family waiting for the patient in the waiting room at the conclusion of the case.   ____________________________ Kathreen DevoidKevin L. Delwyn Scoggin, MD klk:jm D: 10/04/2013 15:57:01 ET T: 10/04/2013 16:26:49 ET JOB#: [id number]  cc: Kathreen DevoidKevin L. Nehemie Casserly, MD, <Dictator> Kathreen DevoidKEVIN L Sequita Wise MD ELECTRONICALLY SIGNED 10/15/2013 23:16 Kathreen DevoidKEVIN L Vickey Ewbank MD ELECTRONICALLY SIGNED 10/15/2013 23:18

## 2014-09-06 NOTE — Op Note (Signed)
PATIENT NAME:  Kristin Mooney, Kristin Mooney MR#:  [id number] DATE OF BIRTH:  02-12-1956  DATE OF PROCEDURE:  11/20/2013  PREOPERATIVE DIAGNOSIS: Posterior dislocation of left total hip replacement.   POSTOPERATIVE DIAGNOSIS:  Posterior dislocation of left total hip replacement.   PROCEDURE PERFORMED: Closed reduction of the left total hip dislocation.   SURGEON: Valinda HoarHoward E. Jerrod Damiano, M.D.   ANESTHESIA: MAC.   COMPLICATIONS: None.   DRAINS: None.   ESTIMATED BLOOD LOSS: None.   DESCRIPTION OF PROCEDURE: The patient was brought to the operating room where she was placed on the operating room table. She dislocated the total hip for the 3rd time last night. She had eaten last night, so we delayed surgery until the morning. She underwent modified anesthesia with deep sedation. The hip was easily reduced with the hip and knee flexed and anterior traction applied toward the ceiling. The hip was stable and leg lengths were equal. The fluoroscopy showed good position of the hip on AP and lateral view. The components are well positioned and well fixed. The patient had a hip abduction brace she had brought from home and this applied to the left thigh and waist. She was then awakened and taken to recovery in good condition.    ____________________________ Valinda HoarHoward E. Keymon Mcelroy, MD hem:ds D: 11/20/2013 11:14:19 ET T: 11/20/2013 20:21:35 ET JOB#: [id number]  cc: Valinda HoarHoward E. Kaydon Husby, MD, <Dictator> Valinda HoarHOWARD E Brystal Kildow MD ELECTRONICALLY SIGNED 11/21/2013 10:26

## 2014-09-06 NOTE — H&P (Signed)
Subjective/Chief Complaint Left hip pain   History of Present Illness Patient is 59 y/o female who underwent a total hip arthroplasty by Dr. Earnestine Leys in August of 2014.  Patient had subsequently sustained recurrent dislocations she states beginning in February.  She was referred to Dr. Rosanna Randy, an arthroplasty specialist at [facility] who treated her in abduction brace for 8 weeks.  After that she had been doing well until last night when she bent over to pull the plug on her computer and stumbled back and dislocated her hip.  She presented to the [facility] ER with an acute dislocation.  Closed reduction attempts in the ER were unsuccessful, although Dr. Owens Shark in the ER thought he had the hip in place and it kept popping back out of place.   Past Med/Surgical Hx:  Pneumonia:   asthma:   depression:   arthritis:   left hip replacement: august 25  left THR:   ORIF right tibia fibula:   eye surgery:   back surgery: 3 times  neck surg:   ALLERGIES:  Sulfa drugs: Swelling  Aspirin: Swelling  HOME MEDICATIONS: Medication Instructions Status  acetaminophen-oxyCODONE 325 mg-5 mg tablet 1-2 tab(s) orally every 4-6 hours- as needed  Active  SEROquel 200 mg oral tablet 2 tab(s) orally once a day (at bedtime) Active  KlonoPIN 1 mg oral tablet 3 tab(s) orally once a day (at bedtime) Active  traMADol 50 mg oral tablet 1 tab(s) orally every 4 hours, As Needed Active  acetaminophen-HYDROcodone 325 mg-5 mg oral tablet 1 tab(s) orally every 6 hours, As Needed Active  Colace sodium 100 mg oral capsule 1 cap(s) orally 2 times a day Active  ferrous sulfate 325 mg oral tablet 1 tab(s) orally 3 times a day Active  Fish Oil 1000 mg oral capsule 1 cap(s) orally 2 times a day Active  biotin 5 mg oral capsule 1 cap(s) orally 2 times a day Active  One-A-Day Essentials Multiple Vitamins oral tablet 1 tab(s) orally once a day Active  Calcium 600+D 1 tab(s) orally once a day Active   Family and  Social History:  Family History Non-Contributory   Place of Living Home   Review of Systems:  Subjective/Chief Complaint Left hip pain   Physical Exam:  GEN no acute distress   HEENT PERRL, hearing intact to voice, moist oral mucosa, Oropharynx clear   NECK supple  No masses  trachea midline   RESP normal resp effort  clear BS  no use of accessory muscles   CARD regular rate  no murmur   ABD denies tenderness  soft  normal BS  no Adominal Mass   LYMPH positive neck, negative neck   EXTR Right lower extremity NVI but shortened and internally rotated.  Pedal pulses are palpable.   SKIN normal to palpation   NEURO motor/sensory function intact   PSYCH A+O to time, place, person   Lab Results: Hepatic:  21-May-15 07:31   Bilirubin, Total 0.3  Alkaline Phosphatase  119 (45-117 NOTE: New Reference Range 04/05/13)  SGPT (ALT) 23  SGOT (AST)  11  Total Protein, Serum 7.5  Albumin, Serum 4.1  Routine Chem:  21-May-15 07:31   Glucose, Serum  106  BUN 14  Creatinine (comp) 0.66  Sodium, Serum 141  Potassium, Serum 3.8  Chloride, Serum 105  CO2, Serum 28  Calcium (Total), Serum 9.2  Osmolality (calc) 282  eGFR (African American) >60  eGFR (Non-African American) >60 (eGFR values <62m/min/1.73 m2 may be an  indication of chronic kidney disease (CKD). Calculated eGFR is useful in patients with stable renal function. The eGFR calculation will not be reliable in acutely ill patients when serum creatinine is changing rapidly. It is not useful in  patients on dialysis. The eGFR calculation may not be applicable to patients at the low and high extremes of body sizes, pregnant women, and vegetarians.)  Anion Gap 8  Routine Coag:  21-May-15 07:31   Prothrombin 12.7  INR 1.0 (INR reference interval applies to patients on anticoagulant therapy. A single INR therapeutic range for coumarins is not optimal for all indications; however, the suggested range for most  indications is 2.0 - 3.0. Exceptions to the INR Reference Range may include: Prosthetic heart valves, acute myocardial infarction, prevention of myocardial infarction, and combinations of aspirin and anticoagulant. The need for a higher or lower target INR must be assessed individually. Reference: The Pharmacology and Management of the Vitamin K  antagonists: the seventh ACCP Conference on Antithrombotic and Thrombolytic Therapy. SPQZR.0076 Sept:126 (3suppl): [id number]. A HCT value >55% may artifactually increase the PT.  In one study,  the increase was an average of 25%. Reference:  "Effect on Routine and Special Coagulation Testing Values of Citrate Anticoagulant Adjustment in Patients with High HCT Values." American Journal of Clinical Pathology 2006;126:400-405.)  Routine Hem:  21-May-15 07:31   WBC (CBC) 8.1  RBC (CBC)  3.56  Hemoglobin (CBC)  11.4  Hematocrit (CBC)  34.0  Platelet Count (CBC)  149  MCV 95  MCH 32.0  MCHC 33.5  RDW 13.7  Neutrophil % 66.9  Lymphocyte % 24.4  Monocyte % 4.8  Eosinophil % 0.8  Basophil % 3.1  Neutrophil # 5.4  Lymphocyte # 2.0  Monocyte # 0.4  Eosinophil # 0.1  Basophil #  0.3 (Result(s) reported on 03 Oct 2013 at 07:45AM.)   Radiology Results: XRay:    21-May-15 06:08, Hip Left Complete  Hip Left Complete  REASON FOR EXAM:    dislocation; history of multiple dislocations  COMMENTS:   Bedside (portable):Y    PROCEDURE: DXR - DXR HIP LEFT COMPLETE  - Oct 03 2013  6:08AM     CLINICAL DATA:  Left hip pain.  Possible dislocation.    EXAM:  LEFT HIP - COMPLETE 2+ VIEW    COMPARISON:  06/21/2013.    FINDINGS:  Single AP view of the pelvis and AP and lateral views of the left  hip again demonstrate postoperative changes of left total hip  arthroplasty. The prosthetic femoral head is superiorly and  laterally dislocated relative to the prosthetic acetabulum,  apparently resting upon the superolateral lip of the acetabulum.  The  bony pelvis itself appears intact, as do the visualized portions of  the proximal femurs bilaterally. Mild to moderate degenerative  changes of osteoarthritis are noted in the right hip joint.     IMPRESSION:  1. Status post left total hip arthroplasty with superior and lateral  dislocation of the left hip.      Electronically Signed    By: Vinnie Langton M.D.    On: 10/03/2013 06:08     Verified By: Etheleen Mayhew, M.D.,    21-May-15 06:48, Pelvis AP Only  Pelvis AP Only  REASON FOR EXAM:    sp reduction,  COMMENTS:       PROCEDURE: DXR - DXR PELVIS AP ONLY  - Oct 03 2013  6:48AM     CLINICAL DATA:  Attempted hip reduction.    EXAM:  PELVIS - 1-2 VIEW    COMPARISON:  10/03/2013 at 5:56 a.m..    FINDINGS:  The left hipremains superolaterally dislocated, and appears  unchanged compared to the pre reduction film.   IMPRESSION:  1. Persistent left hip dislocation, as above.      Electronically Signed    By: Vinnie Langton M.D.    On: 10/03/2013 06:47         Verified By: Etheleen Mayhew, M.D.,  [place]:    21-May-15 06:08, Hip Left Complete  PACS Image    21-May-15 06:48, Pelvis AP Only  PACS Image    Assessment/Admission Diagnosis Left total hip arthroplasty dislocation   Plan I have explained to the patient that she has redislocated her hip.  I am recommending a closed reduction attempt in the OR.  I have discussed this with Dr. Sabra Heck.  The risks and benefits of surgical intervention were discussed in detail with the patient. The patient expressed understanding of the risks and benefits and agreed with plans for surgery. The risks include, but are not limited to: nerve injury, fracture, re-dislocation, failure to reduce the fracture and the need for more surgery including revision total hip arthroplasty.   Electronic Signatures: Thornton Park (MD)  (Signed 21-May-15 12:08)  Authored: CHIEF COMPLAINT and HISTORY, PAST MEDICAL/SURGIAL  HISTORY, ALLERGIES, HOME MEDICATIONS, FAMILY AND SOCIAL HISTORY, REVIEW OF SYSTEMS, PHYSICAL EXAM, LABS, Radiology, ASSESSMENT AND PLAN   Last Updated: 21-May-15 12:08 by Thornton Park (MD)

## 2014-09-06 NOTE — Consult Note (Signed)
Brief Consult Note: Diagnosis: Dislocation of left hip arthroplasty.   Patient was seen by consultant.   Recommend to proceed with surgery or procedure.   Orders entered.   Discussed with Attending MD.   Comments: Patient is 59 y/o female who underwent a total hip arthroplasty by Dr. Deeann SaintHoward Miller in August of 2014.  Patient had subsequently sustained recurrent dislocations she states beginning in February.  She was referred to Dr. Sullivan LoneGilbert, an arthroplasty specialist at [facility] who treated her in abduction brace for 8 weeks.  After that she had been doing well until last night when she bent over to pull the plug on her computer and stumbled back and dislocated her hip.  She presented to the [facility] ER with an acute dislocation.  Closed reduction attempts in the ER were unsuccessful, although Dr. Manson PasseyBrown in the ER thought he had the hip in place and it kept popping back out of place.  I have seen the patient.  She is shortened and internally rotated.  The films show a superior, posterior dislocation.  She is neurovascularly intact.  I have made the patient NPO.  A closed reduction may be attempted in the OR later this AM, after discussing this case with Dr. Hyacinth MeekerMiller.  Electronic Signatures: Juanell FairlyKrasinski, Melinda Gwinner (MD)  (Signed 21-May-15 08:18)  Authored: Brief Consult Note   Last Updated: 21-May-15 08:18 by Juanell FairlyKrasinski, Rand Etchison (MD)
